# Patient Record
Sex: Female | Born: 1980 | Race: Black or African American | Hispanic: No | Marital: Single | Smoking: Never smoker
Health system: Southern US, Community
[De-identification: ages and names within clinical notes are randomized; demographics above are authoritative.]

## PROBLEM LIST (undated history)

## (undated) DIAGNOSIS — G43909 Migraine, unspecified, not intractable, without status migrainosus: Secondary | ICD-10-CM

---

## 2017-01-03 ENCOUNTER — Emergency Department (HOSPITAL_COMMUNITY)
Admission: EM | Admit: 2017-01-03 | Discharge: 2017-01-03 | Disposition: A | Discharge status: Home / Self Care | Payer: Managed Care, Other (non HMO) | Attending: Emergency Medicine | Admitting: Emergency Medicine

## 2017-01-03 ENCOUNTER — Encounter (HOSPITAL_COMMUNITY): Payer: Self-pay | Admitting: Emergency Medicine

## 2017-01-03 ENCOUNTER — Emergency Department (HOSPITAL_COMMUNITY): Payer: Managed Care, Other (non HMO)

## 2017-01-03 DIAGNOSIS — R51 Headache: Secondary | ICD-10-CM | POA: Diagnosis present

## 2017-01-03 DIAGNOSIS — G43009 Migraine without aura, not intractable, without status migrainosus: Secondary | ICD-10-CM

## 2017-01-03 HISTORY — DX: Migraine, unspecified, not intractable, without status migrainosus: G43.909

## 2017-01-03 LAB — POC URINE PREG, ED: PREG TEST UR: NEGATIVE

## 2017-01-03 MED ORDER — KETOROLAC TROMETHAMINE 60 MG/2ML IM SOLN
30.0000 mg | Freq: Once | INTRAMUSCULAR | Status: AC
  Administered 2017-01-03: 30 mg via INTRAMUSCULAR
  Filled 2017-01-03: qty 2

## 2017-01-03 MED ORDER — DIPHENHYDRAMINE HCL 25 MG PO CAPS
25.0000 mg | ORAL_CAPSULE | Freq: Once | ORAL | Status: AC
  Administered 2017-01-03: 25 mg via ORAL
  Filled 2017-01-03: qty 1

## 2017-01-03 MED ORDER — DEXAMETHASONE SODIUM PHOSPHATE 10 MG/ML IJ SOLN
10.0000 mg | Freq: Once | INTRAMUSCULAR | Status: AC
  Administered 2017-01-03: 10 mg via INTRAMUSCULAR
  Filled 2017-01-03: qty 1

## 2017-01-03 MED ORDER — NAPROXEN 375 MG PO TABS: 375.0000 mg | ORAL_TABLET | Freq: Two times a day (BID) | ORAL | 0 refills | Status: AC

## 2017-01-03 MED ORDER — METOCLOPRAMIDE HCL 5 MG/ML IJ SOLN
10.0000 mg | Freq: Once | INTRAMUSCULAR | Status: AC
  Administered 2017-01-03: 10 mg via INTRAMUSCULAR
  Filled 2017-01-03: qty 2

## 2017-01-03 NOTE — ED Notes (Addendum)
Pt transported to CT ?

## 2017-01-03 NOTE — ED Provider Notes (Signed)
[facility]-EMERGENCY DEPT Provider Note   CSN: [id number] Arrival date & time: 01/03/17  0007     History   Chief Complaint Chief Complaint  Patient presents with  . Migraine    HPI Angela Faulkner is a 36 y.o. female.  The history is provided by the patient.  Migraine  This is a recurrent problem. The current episode started yesterday. The problem occurs constantly. The problem has not changed since onset.Pertinent negatives include no chest pain, no abdominal pain and no shortness of breath. Nothing aggravates the symptoms. Nothing relieves the symptoms. Treatments tried: excedrin migraine. The treatment provided no relief.    Past Medical History:  Diagnosis Date  . Migraine     There are no active problems to display for this patient.   History reviewed. No pertinent surgical history.  OB History    No data available       Home Medications    Prior to Admission medications   Not on File    Family History History reviewed. No pertinent family history.  Social History Social History  Substance Use Topics  . Smoking status: Never Smoker  . Smokeless tobacco: Never Used  . Alcohol use Yes     Comment: Rarely     Allergies   Patient has no known allergies.   Review of Systems Review of Systems  Respiratory: Negative for shortness of breath.   Cardiovascular: Negative for chest pain.  Gastrointestinal: Negative for abdominal pain.  All other systems reviewed and are negative.    Physical Exam Updated Vital Signs BP (!) 153/105 (BP Location: Left Arm)   Pulse 65   Temp 98.9 F (37.2 C) (Oral)   Resp (!) 22   SpO2 100%   Physical Exam  Constitutional: She is oriented to person, place, and time. She appears well-developed and well-nourished. No distress.  HENT:  Head: Normocephalic and atraumatic.  Right Ear: External ear normal.  Left Ear: External ear normal.  Nose: Nose normal.  Mouth/Throat: Oropharynx is clear  and moist.  Eyes: Pupils are equal, round, and reactive to light. Conjunctivae and EOM are normal.  No proptosis  Neck: Normal range of motion. Neck supple.  Cardiovascular: Normal rate, regular rhythm, normal heart sounds and intact distal pulses.   Pulmonary/Chest: Effort normal and breath sounds normal. She has no wheezes. She has no rales.  Abdominal: Soft. Bowel sounds are normal. She exhibits no mass. There is no tenderness. There is no rebound and no guarding.  Musculoskeletal: Normal range of motion. She exhibits no edema.  Lymphadenopathy:    She has no cervical adenopathy.  Neurological: She is alert and oriented to person, place, and time. She displays normal reflexes. No cranial nerve deficit. Coordination normal.  Intact cognition  Skin: Skin is warm and dry. Capillary refill takes less than 2 seconds.  Psychiatric: She has a normal mood and affect.     ED Treatments / Results   Vitals:   01/03/17 0100 01/03/17 0230  BP:  126/71  Pulse:  73  Resp: 18 17  Temp:    SpO2:  97%    Radiology  Results for orders placed or performed during the hospital encounter of 01/03/17  POC Urine Pregnancy, ED (do NOT order at [facility])  Result Value Ref Range   Preg Test, Ur NEGATIVE NEGATIVE   Ct Head Wo Contrast  Result Date: 01/03/2017 CLINICAL DATA:  Migraines for 2 weeks. No relief with over the counter medications. EXAM: CT HEAD  WITHOUT CONTRAST TECHNIQUE: Contiguous axial images were obtained from the base of the skull through the vertex without intravenous contrast. COMPARISON:  None. FINDINGS: Brain: No evidence of acute infarction, hemorrhage, hydrocephalus, extra-axial collection or mass lesion/mass effect. Vascular: No hyperdense vessel or unexpected calcification. Skull: Normal. Negative for fracture or focal lesion. Sinuses/Orbits: No acute finding. Other: None. IMPRESSION: No acute intracranial abnormalities. Electronically Signed   By: Burman NievesWilliam  Stevens M.D.   On:  01/03/2017 02:10    Procedures Procedures (including critical care time)  Medications Ordered in ED Medications  ketorolac (TORADOL) injection 30 mg (not administered)  diphenhydrAMINE (BENADRYL) capsule 25 mg (not administered)  metoCLOPramide (REGLAN) injection 10 mg (not administered)  dexamethasone (DECADRON) injection 10 mg (not administered)       Final Clinical Impressions(s) / ED Diagnoses   Migraine: markedly improved post medication.  No signs of acute pathology on exam or CT.  Stable for discharge with close follow up.    Strict return precautions given for  Shortness of breath, swelling or the lips or tongue, chest pain, dyspnea on exertion, new weakness or numbness changes in vision or speech,  Inability to tolerate liquids or food, changes in voice cough, altered mental status or any concerns. No signs of systemic illness or infection. The patient is nontoxic-appearing on exam and vital signs are within normal limits.    I have reviewed the triage vital signs and the nursing notes. Pertinent labs &imaging results that were available during my care of the patient were reviewed by me and considered in my medical decision making (see chart for details).  After history, exam, and medical workup I feel the patient has been appropriately medically screened and is safe for discharge home. Pertinent diagnoses were discussed with the patient. Patient was given return precautions.    Koben Daman, MD 01/03/17 [phone]

## 2017-01-03 NOTE — ED Notes (Signed)
Patient returned from CT

## 2017-01-03 NOTE — ED Triage Notes (Addendum)
Pt BIB EMS from work. Patient has had a migraine intermittently x2 weeks with N/V. No other complaints. Photophobia noted. Patient has attempted to use excedrine and BC powder with no relief. Patient has history of migraines.

## 2017-10-07 ENCOUNTER — Emergency Department (HOSPITAL_COMMUNITY)
Admission: EM | Admit: 2017-10-07 | Discharge: 2017-10-07 | Disposition: A | Discharge status: Home / Self Care | Payer: Self-pay | Attending: Emergency Medicine | Admitting: Emergency Medicine

## 2017-10-07 ENCOUNTER — Emergency Department (HOSPITAL_COMMUNITY): Payer: Self-pay

## 2017-10-07 ENCOUNTER — Encounter (HOSPITAL_COMMUNITY): Payer: Self-pay | Admitting: Emergency Medicine

## 2017-10-07 ENCOUNTER — Other Ambulatory Visit: Payer: Self-pay

## 2017-10-07 DIAGNOSIS — Z79899 Other long term (current) drug therapy: Secondary | ICD-10-CM | POA: Insufficient documentation

## 2017-10-07 DIAGNOSIS — R1032 Left lower quadrant pain: Secondary | ICD-10-CM | POA: Insufficient documentation

## 2017-10-07 LAB — WET PREP, GENITAL
Sperm: NONE SEEN
TRICH WET PREP: NONE SEEN
Yeast Wet Prep HPF POC: NONE SEEN

## 2017-10-07 LAB — COMPREHENSIVE METABOLIC PANEL
ALBUMIN: 3.7 g/dL (ref 3.5–5.0)
ALT: 15 U/L (ref 0–44)
ANION GAP: 8 (ref 5–15)
AST: 14 U/L — ABNORMAL LOW (ref 15–41)
Alkaline Phosphatase: 68 U/L (ref 38–126)
BUN: 6 mg/dL (ref 6–20)
CALCIUM: 9 mg/dL (ref 8.9–10.3)
CHLORIDE: 107 mmol/L (ref 98–111)
CO2: 23 mmol/L (ref 22–32)
Creatinine, Ser: 0.91 mg/dL (ref 0.44–1.00)
GFR calc non Af Amer: >60 mL/min (ref >60)
Glucose, Bld: 117 mg/dL — ABNORMAL HIGH (ref 70–99)
POTASSIUM: 4.1 mmol/L (ref 3.5–5.1)
Sodium: 138 mmol/L (ref 135–145)
Total Bilirubin: 0.5 mg/dL (ref 0.3–1.2)
Total Protein: 6.8 g/dL (ref 6.5–8.1)

## 2017-10-07 LAB — CBC
HEMATOCRIT: 43.5 % (ref 36.0–46.0)
HEMOGLOBIN: 12.9 g/dL (ref 12.0–15.0)
MCH: 22.4 pg — AB (ref 26.0–34.0)
MCHC: 29.7 g/dL — ABNORMAL LOW (ref 30.0–36.0)
MCV: 75.7 fL — AB (ref 78.0–100.0)
Platelets: 340 10*3/uL (ref 150–400)
RBC: 5.75 MIL/uL — AB (ref 3.87–5.11)
RDW: 15.4 % (ref 11.5–15.5)
WBC: 7.5 10*3/uL (ref 4.0–10.5)

## 2017-10-07 LAB — URINALYSIS, ROUTINE W REFLEX MICROSCOPIC
Bilirubin Urine: NEGATIVE
Glucose, UA: NEGATIVE mg/dL
HGB URINE DIPSTICK: NEGATIVE
Ketones, ur: NEGATIVE mg/dL
Leukocytes, UA: NEGATIVE
Nitrite: NEGATIVE
PH: 5 (ref 5.0–8.0)
Protein, ur: NEGATIVE mg/dL
SPECIFIC GRAVITY, URINE: 1.009 (ref 1.005–1.030)

## 2017-10-07 LAB — LIPASE, BLOOD: LIPASE: 29 U/L (ref 11–51)

## 2017-10-07 LAB — I-STAT BETA HCG BLOOD, ED (MC, WL, AP ONLY)

## 2017-10-07 MED ORDER — HYDROCODONE-ACETAMINOPHEN 5-325 MG PO TABS
1.0000 | ORAL_TABLET | Freq: Once | ORAL | Status: AC
  Administered 2017-10-07: 1 via ORAL
  Filled 2017-10-07: qty 1

## 2017-10-07 MED ORDER — SODIUM CHLORIDE 0.9 % IV BOLUS
1000.0000 mL | Freq: Once | INTRAVENOUS | Status: AC
  Administered 2017-10-07: 1000 mL via INTRAVENOUS

## 2017-10-07 MED ORDER — KETOROLAC TROMETHAMINE 30 MG/ML IJ SOLN
15.0000 mg | Freq: Once | INTRAMUSCULAR | Status: AC
  Administered 2017-10-07: 15 mg via INTRAVENOUS
  Filled 2017-10-07: qty 1

## 2017-10-07 MED ORDER — IOHEXOL 300 MG/ML  SOLN
100.0000 mL | Freq: Once | INTRAMUSCULAR | Status: AC | PRN
  Administered 2017-10-07: 100 mL via INTRAVENOUS

## 2017-10-07 MED ORDER — HYDROCODONE-ACETAMINOPHEN 5-325 MG PO TABS: 1.0000 | ORAL_TABLET | Freq: Four times a day (QID) | ORAL | 0 refills | Status: AC | PRN

## 2017-10-07 NOTE — Discharge Instructions (Addendum)
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  This needs to be followed up upon by the gastroenterologist.  There were fibroids noted in the uterus, which can be quite painful.  Please follow-up with OB/GYN on this matter.  Antiinflammatory medications: Take 600 mg of ibuprofen every 6 hours or 440 mg (over the counter dose) to 500 mg (prescription dose) of naproxen every 12 hours for the next 3 days. After this time, these medications may be used as needed for pain. Take these medications with food to avoid upset stomach. Choose only one of these medications, do not take them together. Tylenol: Should you continue to have additional pain while taking the ibuprofen or naproxen, you may add in tylenol as needed. Your daily total maximum amount of tylenol from all sources should be limited to 4000mg /day for persons without liver problems, or 2000mg /day for those with liver problems. Vicodin: May take Vicodin as needed for severe pain.  Do not drive or perform other dangerous activities while taking the Vicodin.  Please note that each pill of Vicodin contains 325 mg of Tylenol and the above dosage limits apply.

## 2017-10-07 NOTE — ED Notes (Signed)
Pt returned from CT °

## 2017-10-07 NOTE — ED Triage Notes (Signed)
Reports LLQ abdominal pain that radiates into groin and back at times.  Describes as sharp burning.  Started on Tuesday and has come and gone since.  Denies any n/v.

## 2017-10-07 NOTE — ED Provider Notes (Signed)
[facility] EMERGENCY DEPARTMENT Provider Note   CSN: [id number] Arrival date & time: 10/07/17  4098     History   Chief Complaint Chief Complaint  Patient presents with  . Abdominal Pain    HPI Angela Faulkner is a 37 y.o. female.  HPI   Angela Faulkner is a 37 y.o. female, with a history of migraines, presenting to the ED with pain in the left flank beginning July 16.  Pain in the left flank is sharp, intermittent, severe, radiating into the left lower abdomen.  Pain in the lower abdomen is burning.  Last menstrual cycle was in May 2019.  Last bowel movement was earlier this morning and was normal. Denies fever/chills, abnormal vaginal discharge/bleeding, N/V/C/D, dysuria, hematuria, or any other complaints.   Past Medical History:  Diagnosis Date  . Migraine   . Migraine     There are no active problems to display for this patient.   History reviewed. No pertinent surgical history.   OB History   None      Home Medications    Prior to Admission medications   Medication Sig Start Date End Date Taking? Authorizing Provider  acetaminophen (TYLENOL) 650 MG CR tablet Take 1,950 mg by mouth every 8 (eight) hours as needed for pain.   Yes [provider]  BREO ELLIPTA 200-25 MCG/INH AEPB Inhale 1 puff into the lungs daily. 09/03/17  Yes [provider]  JUNEL FE 1/20 1-20 MG-MCG tablet Take 1 tablet by mouth daily. 09/04/17  Yes [provider]  SUMAtriptan (IMITREX) 100 MG tablet Take 100 mg by mouth daily as needed for migraine or headache.  08/21/17  Yes [provider]  HYDROcodone-acetaminophen (NORCO/VICODIN) 5-325 MG tablet Take 1-2 tablets by mouth every 6 (six) hours as needed for severe pain. 10/07/17   Joy, Shawn C, PA-C  naproxen (NAPROSYN) 375 MG tablet Take 1 tablet (375 mg total) by mouth 2 (two) times daily. Patient not taking: Reported on 10/07/2017 01/03/17   Nicanor Alcon, April, MD    Family History No family  history on file.  Social History Social History   Tobacco Use  . Smoking status: Never Smoker  . Smokeless tobacco: Never Used  Substance Use Topics  . Alcohol use: Yes    Comment: Rarely  . Drug use: No     Allergies   Patient has no known allergies.   Review of Systems Review of Systems  Constitutional: Negative for chills and fever.  Respiratory: Negative for shortness of breath.   Cardiovascular: Negative for chest pain.  Gastrointestinal: Positive for abdominal pain. Negative for constipation, diarrhea, nausea and vomiting.  Genitourinary: Positive for flank pain. Negative for dysuria, hematuria, vaginal bleeding and vaginal discharge.  Musculoskeletal: Positive for back pain.  Neurological: Negative for weakness and numbness.  All other systems reviewed and are negative.    Physical Exam Updated Vital Signs BP (!) 156/96 (BP Location: Right Arm)   Pulse 85   Temp 99 F (37.2 C) (Oral)   Resp 18   Ht 5\' 6"  (1.676 m)   Wt 93 kg (205 lb)   SpO2 100%   BMI 33.09 kg/m   Physical Exam  Constitutional: She appears well-developed and well-nourished. No distress.  HENT:  Head: Normocephalic and atraumatic.  Eyes: Conjunctivae are normal.  Neck: Neck supple.  Cardiovascular: Normal rate, regular rhythm, normal heart sounds and intact distal pulses.  Pulmonary/Chest: Effort normal and breath sounds normal. No respiratory distress.  Abdominal: Soft. Bowel  sounds are normal. There is tenderness. There is CVA tenderness (left). There is no guarding.    Genitourinary:  Genitourinary Comments: External genitalia normal Vagina with discharge - thick, white discharge Cervix  normal negative for cervical motion tenderness Adnexa palpated, no masses, positive for tenderness noted Bladder palpated negative for tenderness Uterus palpated with likely mass noted, positive for tenderness  No inguinal lymphadenopathy. Otherwise normal female genitalia. RN, Aundra Millet, served  as Biomedical engineer during exam.  Musculoskeletal: She exhibits no edema.  Lymphadenopathy:    She has no cervical adenopathy.  Neurological: She is alert.  Skin: Skin is warm and dry. She is not diaphoretic.  Psychiatric: She has a normal mood and affect. Her behavior is normal.  Nursing note and vitals reviewed.    ED Treatments / Results  Labs (all labs ordered are listed, but only abnormal results are displayed) Labs Reviewed  WET PREP, GENITAL - Abnormal; Notable for the following components:      Result Value   Clue Cells Wet Prep HPF POC PRESENT (*)    WBC, Wet Prep HPF POC MODERATE (*)    All other components within normal limits  COMPREHENSIVE METABOLIC PANEL - Abnormal; Notable for the following components:   Glucose, Bld 117 (*)    AST 14 (*)    All other components within normal limits  CBC - Abnormal; Notable for the following components:   RBC 5.75 (*)    MCV 75.7 (*)    MCH 22.4 (*)    MCHC 29.7 (*)    All other components within normal limits  URINALYSIS, ROUTINE W REFLEX MICROSCOPIC - Abnormal; Notable for the following components:   Color, Urine STRAW (*)    All other components within normal limits  LIPASE, BLOOD  RPR  HIV ANTIBODY (ROUTINE TESTING)  I-STAT BETA HCG BLOOD, ED ([facility], [facility], [facility] ONLY)  GC/CHLAMYDIA PROBE AMP ([place]) NOT AT [facility]    EKG None  Radiology US Transvaginal Non-ob  Result Date: 10/07/2017 CLINICAL DATA:  Left lower quadrant pain EXAM: TRANSABDOMINAL AND TRANSVAGINAL ULTRASOUND OF PELVIS DOPPLER ULTRASOUND OF OVARIES TECHNIQUE: Both transabdominal and transvaginal ultrasound examinations of the pelvis were performed. Transabdominal technique was performed for global imaging of the pelvis including uterus, ovaries, adnexal regions, and pelvic cul-de-sac. It was necessary to proceed with endovaginal exam following the transabdominal exam to visualize the uterus, endometrium and adnexae in better detail. Color and duplex Doppler ultrasound  was utilized to evaluate blood flow to the ovaries. COMPARISON:  10/07/2017 CT FINDINGS: Uterus Measurements: 14.1 x 7.5 x 9.5 cm. Heterogeneous nodular appearance related to several uterine masses compatible with fibroids. Largest anterior fibroid measures 4.8 x 3.2 x 3.7 cm with central cystic degeneration. Several additional fibroids noted measuring 2 cm or less in size. These findings correlate with the CT. Endometrium Thickness: 9 mm.  No focal abnormality visualized. Right ovary Measurements: 4.1 x 3.2 x 3.1 cm. Difficult to visualize by ultrasound. No gross abnormality. Left ovary Measurements: 3.2 x 2.4 x 3.4 cm. Difficult to visualize. Only seen transabdominally. Pulsed Doppler evaluation of both ovaries demonstrates normal low-resistance arterial and venous waveforms. Other findings Trace pelvic free fluid likely physiologic. IMPRESSION: Enlarged 14 cm fibroid uterus. Limited assessment of the ovaries because of bowel gas and body habitus. No gross abnormality or evidence of torsion. Trace pelvic free fluid likely physiologic No definite acute process by ultrasound Electronically Signed   By: Judie Petit.  Shick M.D.   On: 10/07/2017 12:15   US Pelvis Complete  Result Date: 10/07/2017 CLINICAL DATA:  Left lower quadrant pain EXAM: TRANSABDOMINAL AND TRANSVAGINAL ULTRASOUND OF PELVIS DOPPLER ULTRASOUND OF OVARIES TECHNIQUE: Both transabdominal and transvaginal ultrasound examinations of the pelvis were performed. Transabdominal technique was performed for global imaging of the pelvis including uterus, ovaries, adnexal regions, and pelvic cul-de-sac. It was necessary to proceed with endovaginal exam following the transabdominal exam to visualize the uterus, endometrium and adnexae in better detail. Color and duplex Doppler ultrasound was utilized to evaluate blood flow to the ovaries. COMPARISON:  10/07/2017 CT FINDINGS: Uterus Measurements: 14.1 x 7.5 x 9.5 cm. Heterogeneous nodular appearance related to several  uterine masses compatible with fibroids. Largest anterior fibroid measures 4.8 x 3.2 x 3.7 cm with central cystic degeneration. Several additional fibroids noted measuring 2 cm or less in size. These findings correlate with the CT. Endometrium Thickness: 9 mm.  No focal abnormality visualized. Right ovary Measurements: 4.1 x 3.2 x 3.1 cm. Difficult to visualize by ultrasound. No gross abnormality. Left ovary Measurements: 3.2 x 2.4 x 3.4 cm. Difficult to visualize. Only seen transabdominally. Pulsed Doppler evaluation of both ovaries demonstrates normal low-resistance arterial and venous waveforms. Other findings Trace pelvic free fluid likely physiologic. IMPRESSION: Enlarged 14 cm fibroid uterus. Limited assessment of the ovaries because of bowel gas and body habitus. No gross abnormality or evidence of torsion. Trace pelvic free fluid likely physiologic No definite acute process by ultrasound Electronically Signed   By: Judie PetitM.  Shick M.D.   On: 10/07/2017 12:15   Ct Abdomen Pelvis W Contrast  Result Date: 10/07/2017 CLINICAL DATA:  Left lower quadrant pain EXAM: CT ABDOMEN AND PELVIS WITH CONTRAST TECHNIQUE: Multidetector CT imaging of the abdomen and pelvis was performed using the standard protocol following bolus administration of intravenous contrast. CONTRAST:  100mL OMNIPAQUE IOHEXOL 300 MG/ML  SOLN COMPARISON:  None. FINDINGS: Lower chest: No acute abnormality. Hepatobiliary: Focal fatty infiltration of the liver along the falciform ligament anteriorly, image 23 series 4. No other focal hepatic abnormality or ductal dilatation. Hepatic and portal veins are patent. Gallstones noted. Gallbladder remains nondistended. Biliary system nondilated. Pancreas: Pancreas body demonstrates a slightly exophytic minimally complex septated hypodense cystic lesion measuring 2.1 x 2.1 cm, image 20 series 4. Differential considerations include mucinous cystic neoplasm versus side branch IMPN. No associated ductal dilatation  or surrounding inflammatory process. Consider further evaluation with nonemergent follow up MRI abdomen without and with contrast. Spleen: Normal in size without focal abnormality. Adrenals/Urinary Tract: Adrenal glands are unremarkable. Kidneys are normal, without renal calculi, focal lesion, or hydronephrosis. Bladder is unremarkable. Stomach/Bowel: Stomach is within normal limits. Appendix appears normal. No evidence of bowel wall thickening, distention, or inflammatory changes. Vascular/Lymphatic: No significant vascular findings are present. No enlarged abdominal or pelvic lymph nodes. Reproductive: Uterus is enlarged with scattered nodular masses with heterogeneous enhancement compatible with several uterine fibroids, largest in the anterior lower uterine segment measures 4.7 cm. No pelvic free fluid. Ovaries are prominent bilaterally. Some of the fibroids have dystrophic calcification and central degeneration. Other: No abdominal wall hernia or abnormality. No abdominopelvic ascites. Musculoskeletal: No acute or significant osseous findings. IMPRESSION: Cholelithiasis 2.1 cm hypodense cystic pancreas body lesion, indeterminate by CT. See above comment and recommendation for follow-up nonemergent imaging. Enlarged fibroid uterus No other acute intra-abdominal or pelvic finding. Electronically Signed   By: Judie PetitM.  Shick M.D.   On: 10/07/2017 11:20   [place] Art/ven Flow Abd Pelv Doppler  Result Date: 10/07/2017 CLINICAL DATA:  Left lower quadrant pain EXAM: TRANSABDOMINAL AND TRANSVAGINAL  ULTRASOUND OF PELVIS DOPPLER ULTRASOUND OF OVARIES TECHNIQUE: Both transabdominal and transvaginal ultrasound examinations of the pelvis were performed. Transabdominal technique was performed for global imaging of the pelvis including uterus, ovaries, adnexal regions, and pelvic cul-de-sac. It was necessary to proceed with endovaginal exam following the transabdominal exam to visualize the uterus, endometrium and adnexae in better  detail. Color and duplex Doppler ultrasound was utilized to evaluate blood flow to the ovaries. COMPARISON:  10/07/2017 CT FINDINGS: Uterus Measurements: 14.1 x 7.5 x 9.5 cm. Heterogeneous nodular appearance related to several uterine masses compatible with fibroids. Largest anterior fibroid measures 4.8 x 3.2 x 3.7 cm with central cystic degeneration. Several additional fibroids noted measuring 2 cm or less in size. These findings correlate with the CT. Endometrium Thickness: 9 mm.  No focal abnormality visualized. Right ovary Measurements: 4.1 x 3.2 x 3.1 cm. Difficult to visualize by ultrasound. No gross abnormality. Left ovary Measurements: 3.2 x 2.4 x 3.4 cm. Difficult to visualize. Only seen transabdominally. Pulsed Doppler evaluation of both ovaries demonstrates normal low-resistance arterial and venous waveforms. Other findings Trace pelvic free fluid likely physiologic. IMPRESSION: Enlarged 14 cm fibroid uterus. Limited assessment of the ovaries because of bowel gas and body habitus. No gross abnormality or evidence of torsion. Trace pelvic free fluid likely physiologic No definite acute process by ultrasound Electronically Signed   By: Judie Petit.  Shick M.D.   On: 10/07/2017 12:15    Procedures Procedures (including critical care time)  Medications Ordered in ED Medications  ketorolac (TORADOL) 30 MG/ML injection 15 mg (15 mg Intravenous Given 10/07/17 1015)  sodium chloride 0.9 % bolus 1,000 mL (0 mLs Intravenous Stopped 10/07/17 1146)  iohexol (OMNIPAQUE) 300 MG/ML solution 100 mL (100 mLs Intravenous Contrast Given 10/07/17 1025)  HYDROcodone-acetaminophen (NORCO/VICODIN) 5-325 MG per tablet 1 tablet (1 tablet Oral Given 10/07/17 1344)     Initial Impression / Assessment and Plan / ED Course  I have reviewed the triage vital signs and the nursing notes.  Pertinent labs & imaging results that were available during my care of the patient were reviewed by me and considered in my medical decision  making (see chart for details).  Clinical Course as of Oct 07 1399  Sun Oct 07, 2017  1259 Spoke with Dr. Dulce Sellar, [facility], regarding the pancreatic abnormalities noted on CT.  States patient can follow-up with them in the clinic and they will take care of any further imaging and/or treatment that may be necessary.   [SJ]    Clinical Course User Index [SJ] Joy, Shawn C, PA-C    Patient presents with left flank and lower abdominal pain. Patient is nontoxic appearing, afebrile, not tachycardic, not tachypneic, not hypotensive, maintains SPO2 of 100% on room air, and is in no apparent distress.   Beginning differential included renal colic due to stone, however, there is no hematuria on UA.  No evidence of urinary infection.  No GI symptoms.  Pancreatic cystic lesion noted on CT, unlikely to be the cause of the patient's pain.  Prominent urine fibroids noted on ultrasound, which may be the cause of the patient's pain.  She will follow-up with GI and OB/GYN.  Return precautions discussed.  Patient voices understanding of these instructions, accepts the plan, and is comfortable with discharge.  Vitals:   10/07/17 1215 10/07/17 1230 10/07/17 1245 10/07/17 1300  BP:  (!) 143/91  135/82  Pulse: 80 65 75 67  Resp:      Temp:      TempSrc:  SpO2: 100% 100% 100% 100%  Weight:      Height:         Final Clinical Impressions(s) / ED Diagnoses   Final diagnoses:  Left lower quadrant pain    ED Discharge Orders        Ordered    HYDROcodone-acetaminophen (NORCO/VICODIN) 5-325 MG tablet  Every 6 hours PRN     10/07/17 1315       Anselm Pancoast, PA-C 10/07/17 1428    Pricilla Loveless, MD 10/09/17 1540

## 2017-10-08 LAB — RPR: RPR Ser Ql: NONREACTIVE

## 2017-10-08 LAB — HIV ANTIBODY (ROUTINE TESTING W REFLEX): HIV SCREEN 4TH GENERATION: NONREACTIVE

## 2017-10-08 LAB — GC/CHLAMYDIA PROBE AMP (~~LOC~~) NOT AT ARMC
CHLAMYDIA, DNA PROBE: NEGATIVE
NEISSERIA GONORRHEA: NEGATIVE

## 2019-08-30 IMAGING — CT CT ABD-PELV W/ CM
2 of 4 series · 16 of 46 positions shown, 18 images · IV contrast (Omni 300)
Comparison: None.

CLINICAL DATA: Left lower quadrant pain

EXAM:
CT ABDOMEN AND PELVIS WITH CONTRAST
TECHNIQUE: Multidetector CT imaging of the abdomen and pelvis was performed
using the standard protocol following bolus administration of
intravenous contrast.
CONTRAST:  100mL OMNIPAQUE IOHEXOL 300 MG/ML  SOLN

[Series 4: a/p w/ 5mm · axial · 0.90mm/px · z∈[+928,+1373]mm · 13 of 97 slices shown, 15 images]
[im 4/97  soft-tissue]
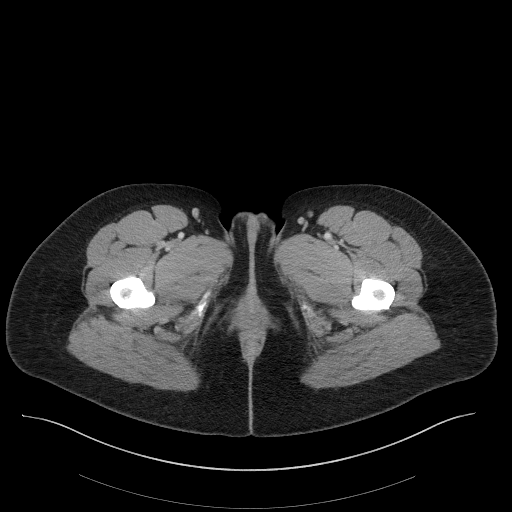
[im 4/97  bone]
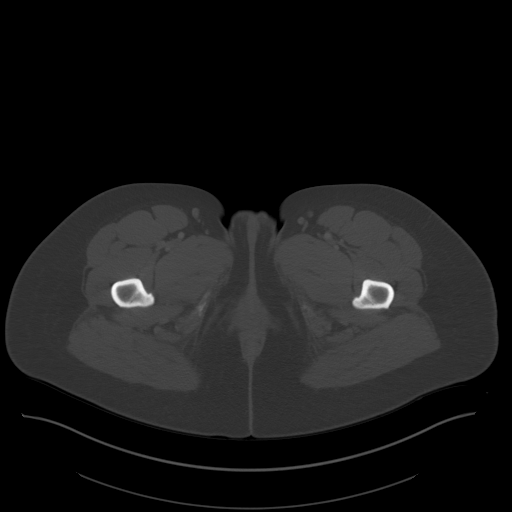
[im 12/97  soft-tissue]
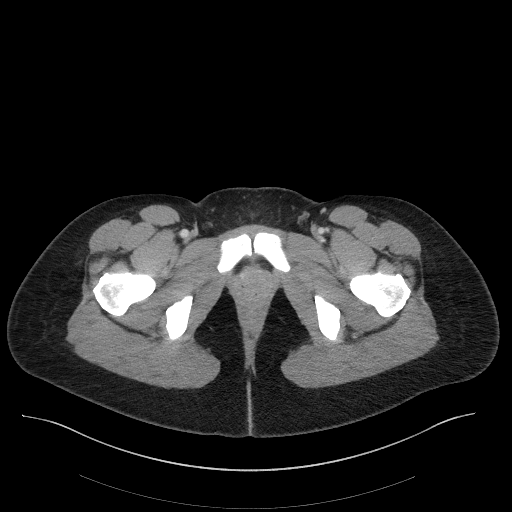
[im 19/97  soft-tissue]
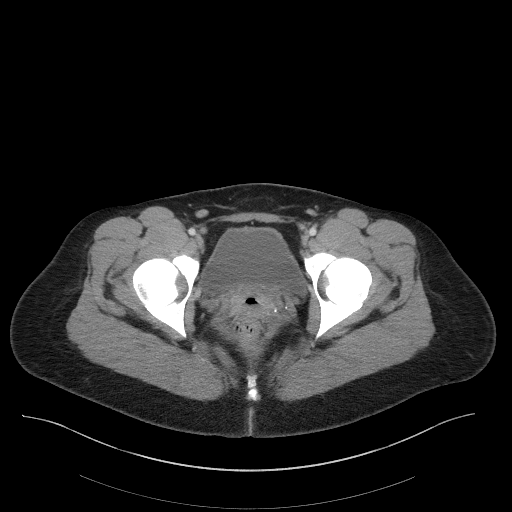
[im 26/97  soft-tissue]
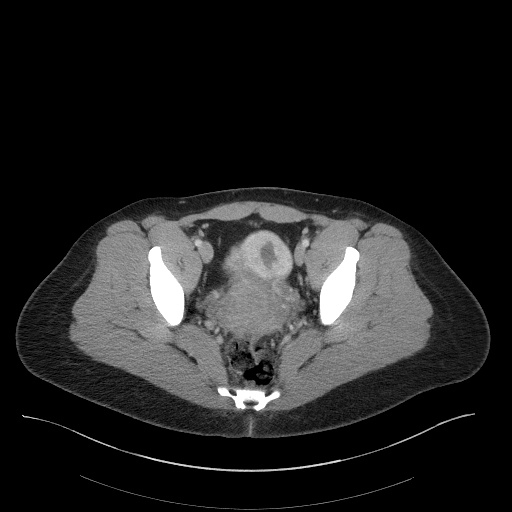
[im 34/97  soft-tissue]
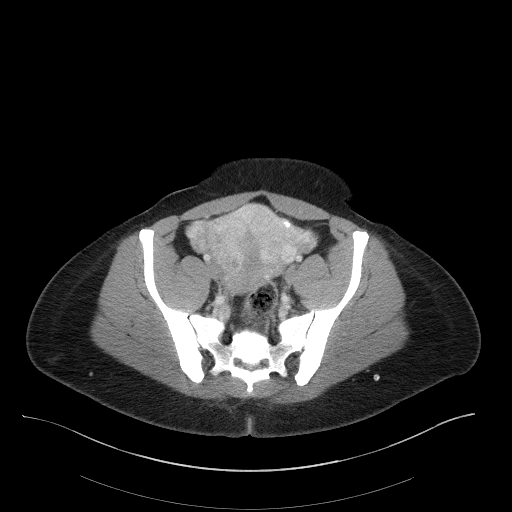
[im 41/97  soft-tissue]
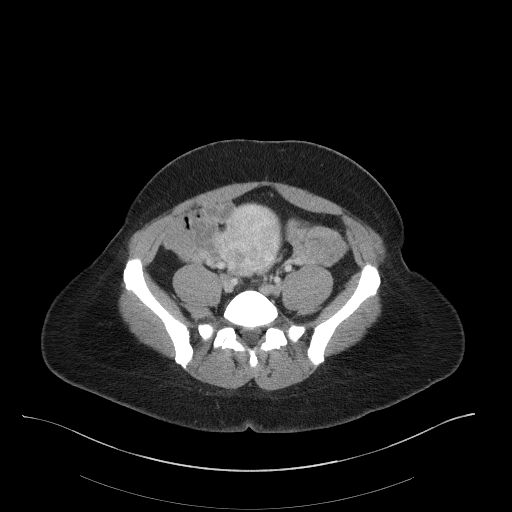
[im 49/97  soft-tissue]
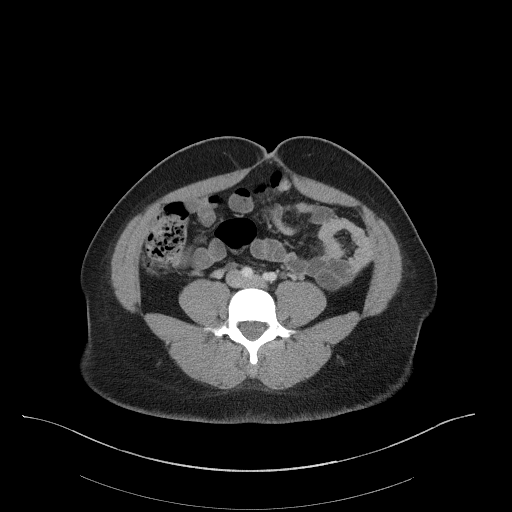
[im 56/97  soft-tissue]
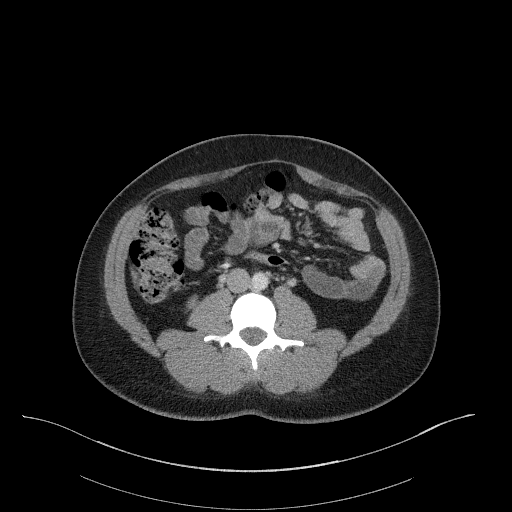
[im 63/97  soft-tissue]
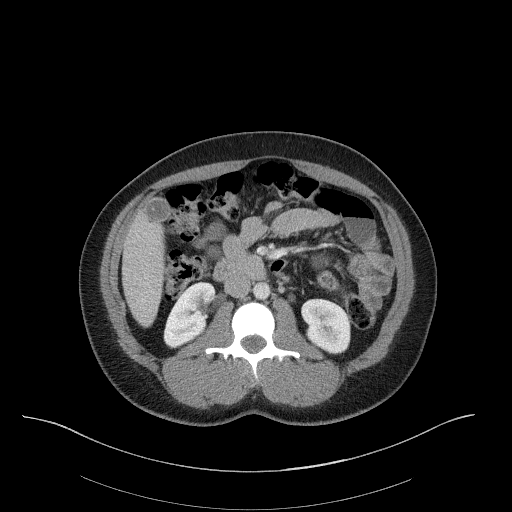
[im 63/97  bone]
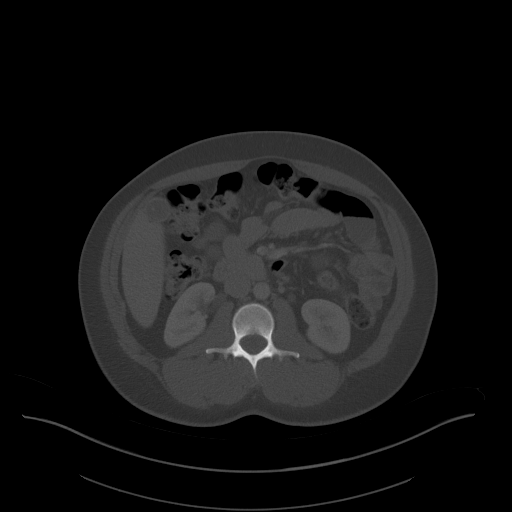
[im 71/97  soft-tissue]
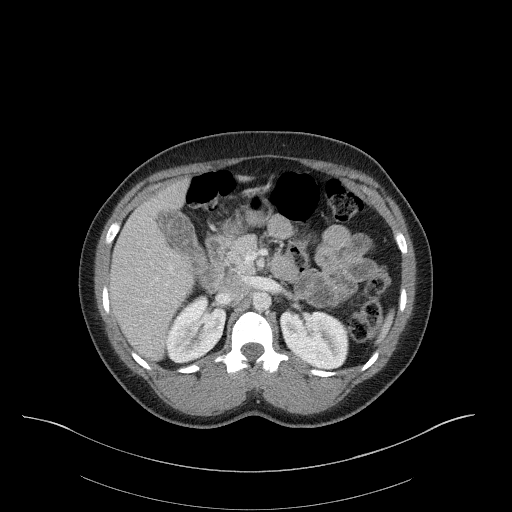
[im 78/97  soft-tissue]
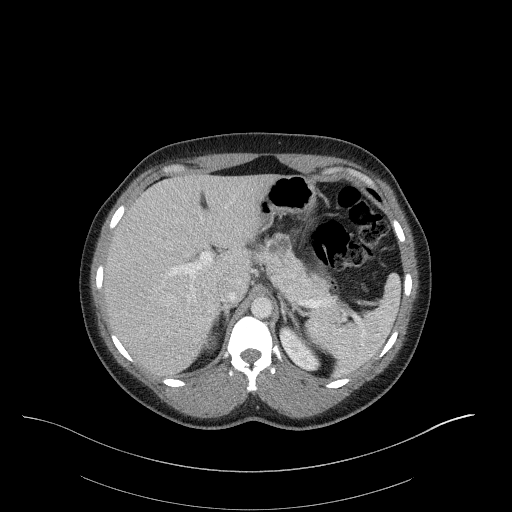
[im 85/97  soft-tissue]
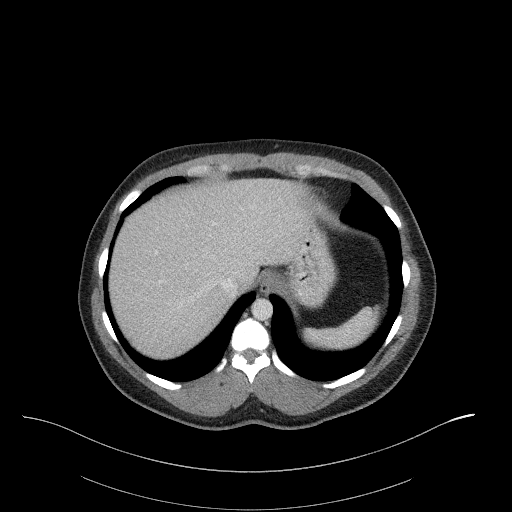
[im 93/97  soft-tissue]
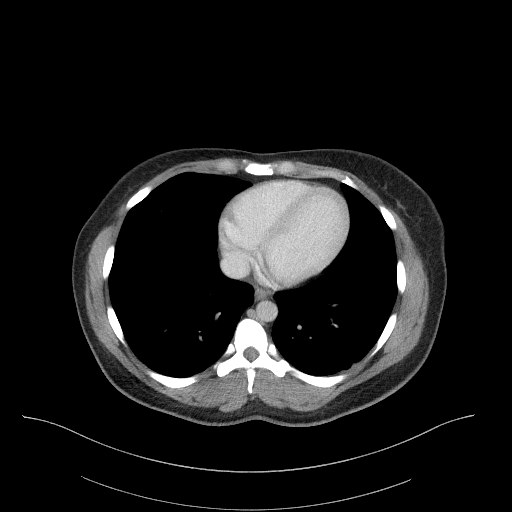

[Series 7: a/p w/ cor · coronal · 0.83mm/px · 3 of 151 slices shown]
[im 51/151  soft-tissue]
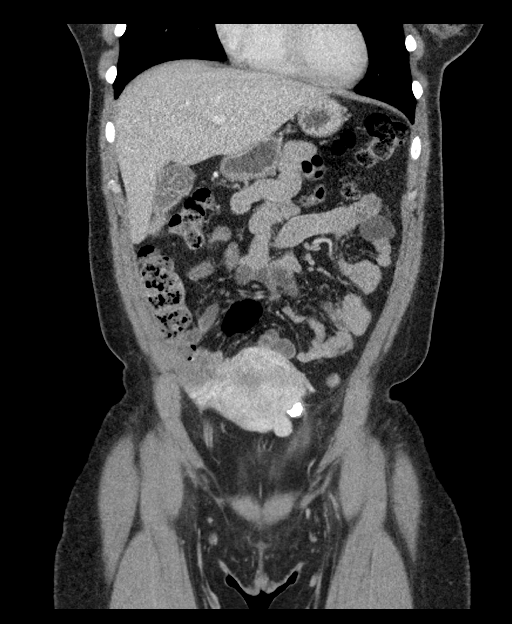
[im 67/151  soft-tissue]
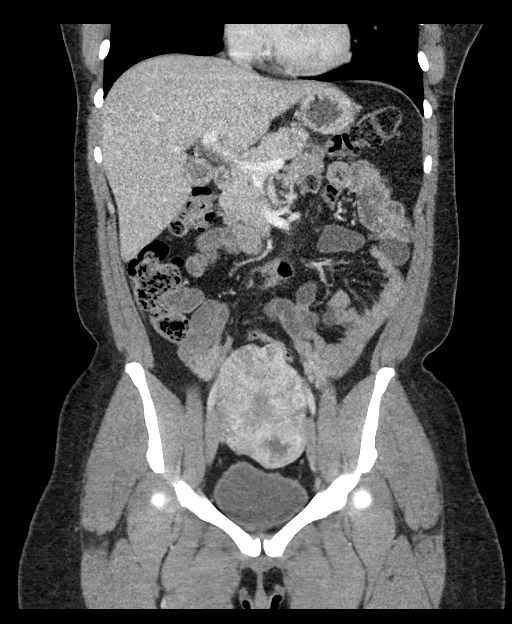
[im 84/151  soft-tissue]
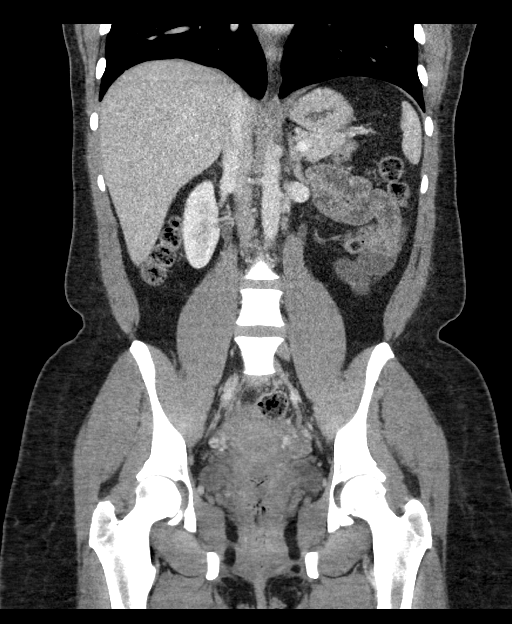

[16 of 46 positions shown; findings below may reference images not displayed]

FINDINGS: Lower chest: No acute abnormality.

Hepatobiliary: Focal fatty infiltration of the liver along the
falciform ligament anteriorly, image 23 series 4. No other focal
hepatic abnormality or ductal dilatation. Hepatic and portal veins
are patent. Gallstones noted. Gallbladder remains nondistended.
Biliary system nondilated.

Pancreas: Pancreas body demonstrates a slightly exophytic minimally
complex septated hypodense cystic lesion measuring 2.1 x 2.1 cm,
image 20 series 4. Differential considerations include mucinous
cystic neoplasm versus side branch IMPN. No associated ductal
dilatation or surrounding inflammatory process. Consider further
evaluation with nonemergent follow up MRI abdomen without and with
contrast.

Spleen: Normal in size without focal abnormality.

Adrenals/Urinary Tract: Adrenal glands are unremarkable. Kidneys are
normal, without renal calculi, focal lesion, or hydronephrosis.
Bladder is unremarkable.

Stomach/Bowel: Stomach is within normal limits. Appendix appears
normal. No evidence of bowel wall thickening, distention, or
inflammatory changes.

Vascular/Lymphatic: No significant vascular findings are present. No
enlarged abdominal or pelvic lymph nodes.

Reproductive: Uterus is enlarged with scattered nodular masses with
heterogeneous enhancement compatible with several uterine fibroids,
largest in the anterior lower uterine segment measures 4.7 cm. No
pelvic free fluid. Ovaries are prominent bilaterally. Some of the
fibroids have dystrophic calcification and central degeneration.

Other: No abdominal wall hernia or abnormality. No abdominopelvic
ascites.

Musculoskeletal: No acute or significant osseous findings.
IMPRESSION: Cholelithiasis

2.1 cm hypodense cystic pancreas body lesion, indeterminate by CT.
See above comment and recommendation for follow-up nonemergent
imaging.

Enlarged fibroid uterus

No other acute intra-abdominal or pelvic finding.
